# Patient Record
Sex: Female | Born: 1960 | Race: White | Hispanic: No | Marital: Married | Smoking: Current every day smoker
Health system: Southern US, Community
[De-identification: ages and names within clinical notes are randomized; demographics above are authoritative.]

---

## 2019-03-07 ENCOUNTER — Emergency Department (HOSPITAL_COMMUNITY)
Admission: EM | Admit: 2019-03-07 | Discharge: 2019-03-08 | Disposition: A | Discharge status: Home / Self Care | Payer: BLUE CROSS/BLUE SHIELD | Attending: Emergency Medicine | Admitting: Emergency Medicine

## 2019-03-07 DIAGNOSIS — Z5321 Procedure and treatment not carried out due to patient leaving prior to being seen by health care provider: Secondary | ICD-10-CM | POA: Insufficient documentation

## 2019-03-07 DIAGNOSIS — R1013 Epigastric pain: Secondary | ICD-10-CM | POA: Insufficient documentation

## 2019-03-08 ENCOUNTER — Other Ambulatory Visit: Payer: Self-pay

## 2019-03-08 ENCOUNTER — Encounter (HOSPITAL_COMMUNITY): Payer: Self-pay | Admitting: *Deleted

## 2019-03-08 ENCOUNTER — Emergency Department (HOSPITAL_COMMUNITY): Payer: BLUE CROSS/BLUE SHIELD

## 2019-03-08 LAB — BASIC METABOLIC PANEL
Anion gap: 11 (ref 5–15)
BUN: 20 mg/dL (ref 6–20)
CO2: 26 mmol/L (ref 22–32)
Calcium: 9.5 mg/dL (ref 8.9–10.3)
Chloride: 101 mmol/L (ref 98–111)
Creatinine, Ser: 0.76 mg/dL (ref 0.44–1.00)
GFR calc Af Amer: >60 mL/min (ref >60)
GFR calc non Af Amer: >60 mL/min (ref >60)
Glucose, Bld: 108 mg/dL — ABNORMAL HIGH (ref 70–99)
Potassium: 3.7 mmol/L (ref 3.5–5.1)
Sodium: 138 mmol/L (ref 135–145)

## 2019-03-08 LAB — CBC
HCT: 44.5 % (ref 36.0–46.0)
Hemoglobin: 14.7 g/dL (ref 12.0–15.0)
MCH: 30.8 pg (ref 26.0–34.0)
MCHC: 33 g/dL (ref 30.0–36.0)
MCV: 93.1 fL (ref 80.0–100.0)
Platelets: 229 10*3/uL (ref 150–400)
RBC: 4.78 MIL/uL (ref 3.87–5.11)
RDW: 12 % (ref 11.5–15.5)
WBC: 8.2 10*3/uL (ref 4.0–10.5)
nRBC: 0 % (ref 0.0–0.2)

## 2019-03-08 LAB — TROPONIN I (HIGH SENSITIVITY): Troponin I (High Sensitivity): <2 ng/L (ref <18)

## 2019-03-08 MED ORDER — SODIUM CHLORIDE 0.9% FLUSH: 3.0000 mL | Freq: Once | INTRAVENOUS | Status: DC

## 2019-03-08 NOTE — ED Notes (Signed)
Patient states she has to leave the state by morning in order to avoid having to quarantine once in her home state. States her dogs are back at the hotel and one is sick. Asks if she can just register to see lab results. Advised pt to stay and be seen but she states she is anxious to get home and is going to leave.

## 2019-03-08 NOTE — ED Triage Notes (Signed)
The pt is treveling here from [place] and has had epigastric pain with rt lateral pain all day with some lt leg and abd pain   She still has her gallbladder

## 2020-07-06 IMAGING — CR DG CHEST 2V
2 series · 2 of 2 positions shown · non-contrast
Comparison: None.

CLINICAL DATA: Right chest pain, shortness of breath

EXAM:
CHEST - 2 VIEW

[chest pa]
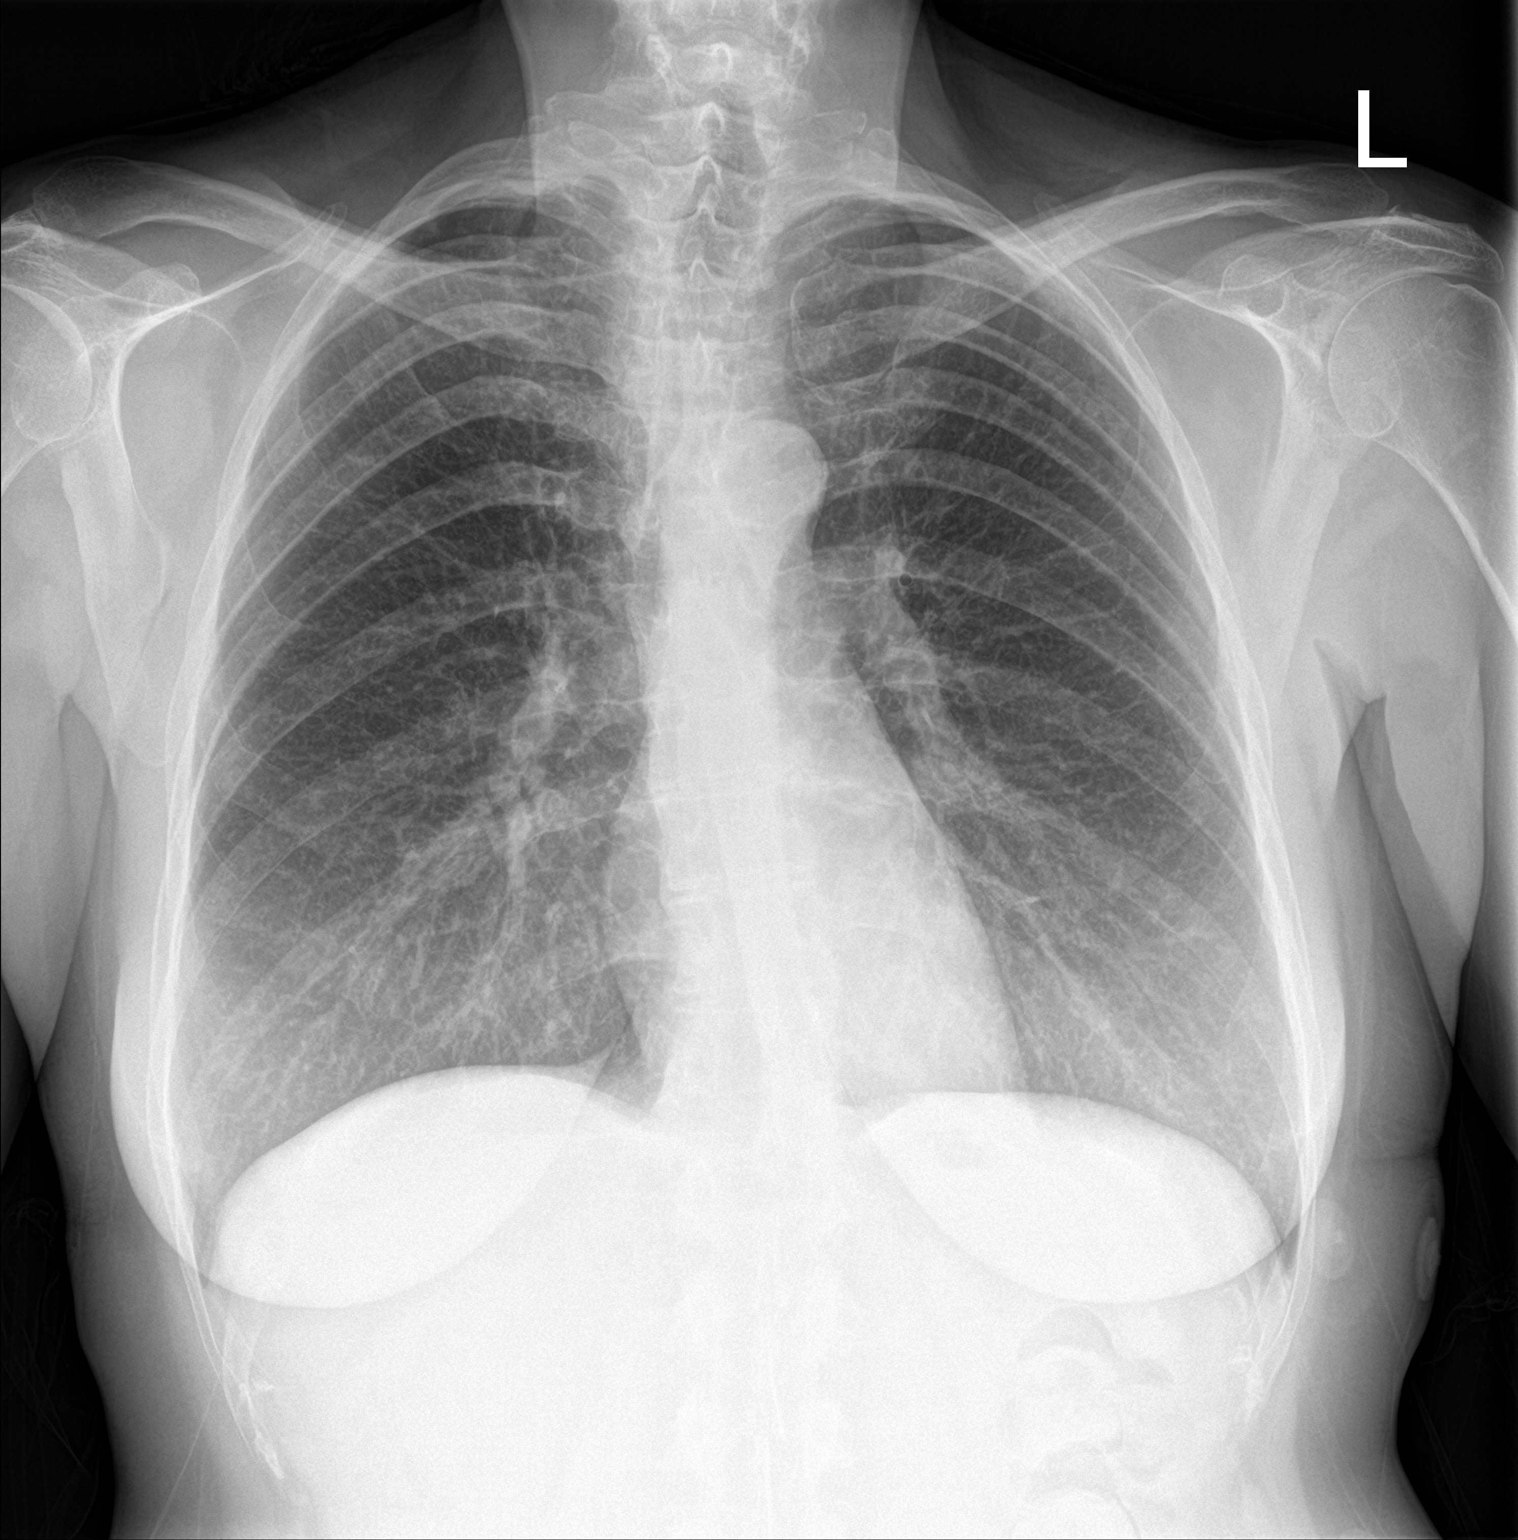

[chest lat]
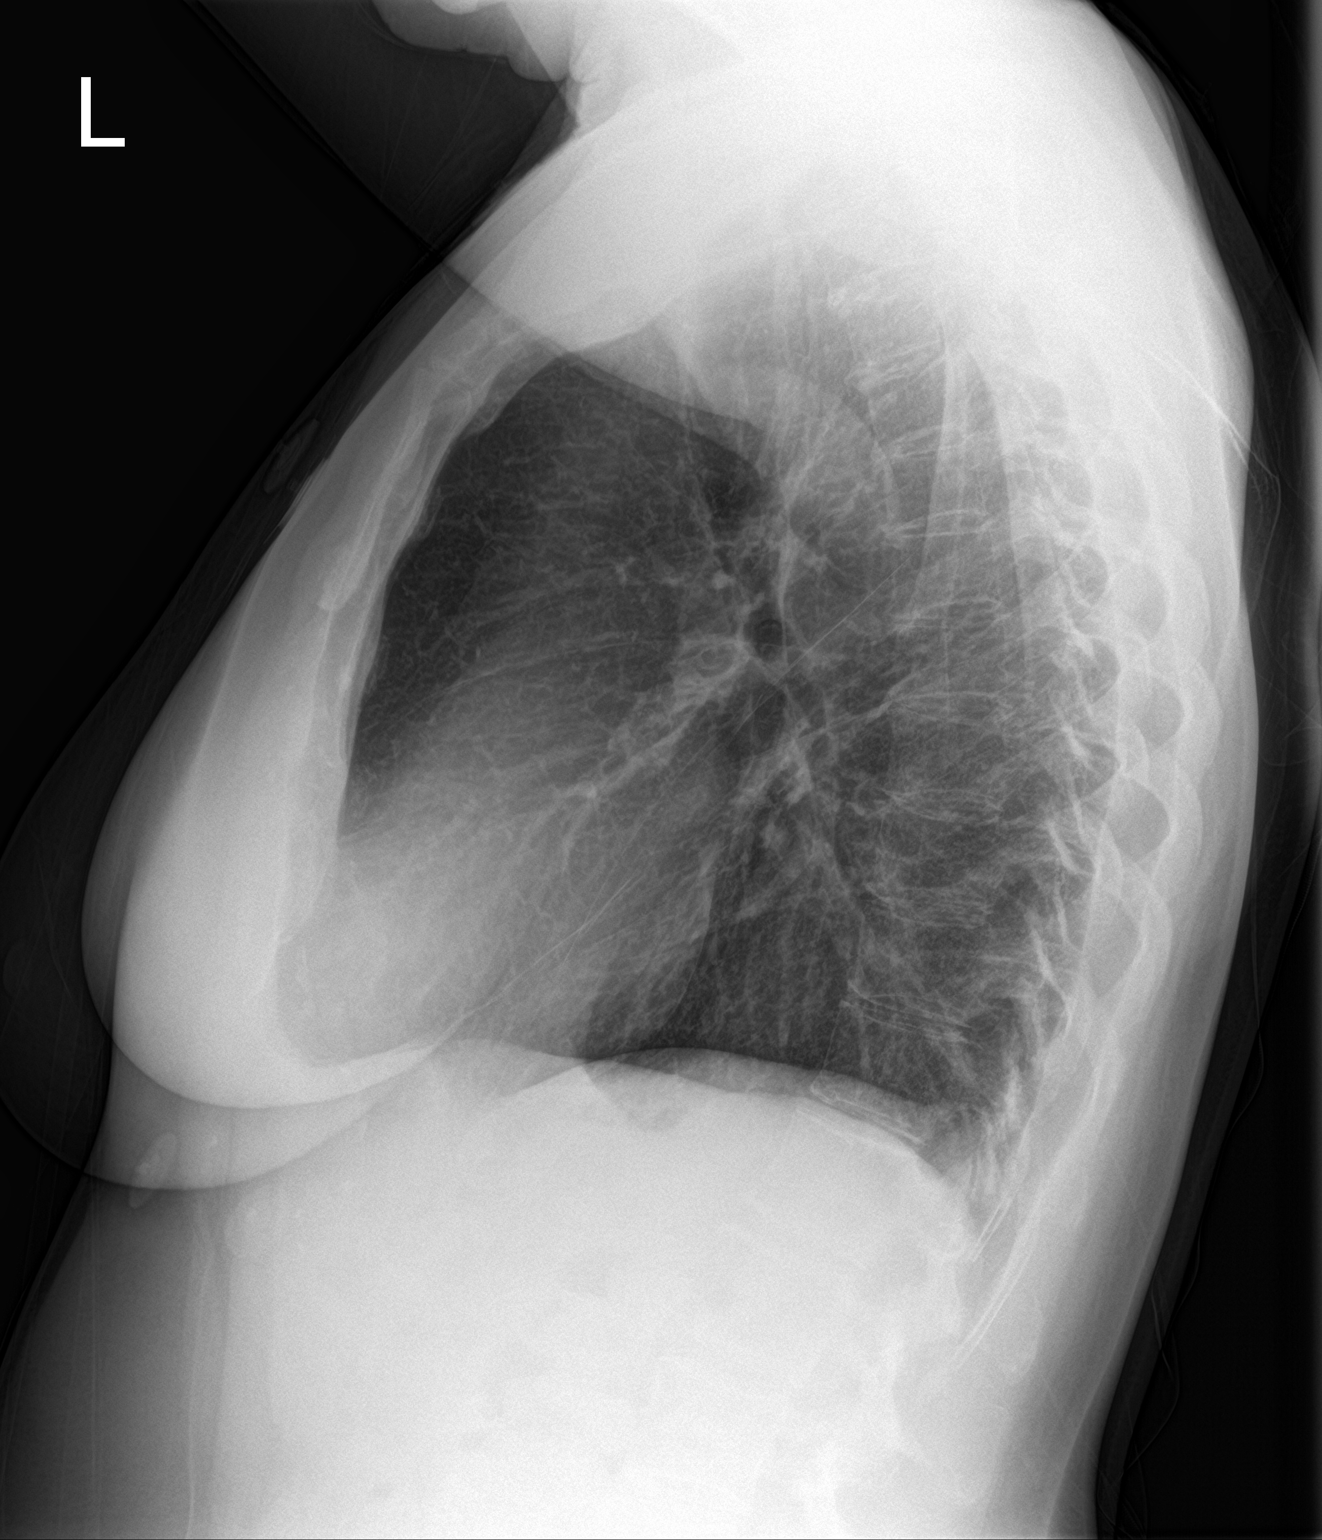

[2 of 2 positions shown; findings below may reference images not displayed]

FINDINGS: Heart and mediastinal contours are within normal limits. No focal
opacities or effusions. No acute bony abnormality.
IMPRESSION: No active cardiopulmonary disease.
